# Patient Record
Sex: Male | Born: 1965 | Hispanic: No | Marital: Single | Smoking: Never smoker
Health system: Southern US, Community
[De-identification: ages and names within clinical notes are randomized; demographics above are authoritative.]

## PROBLEM LIST (undated history)

## (undated) DIAGNOSIS — N289 Disorder of kidney and ureter, unspecified: Secondary | ICD-10-CM

## (undated) DIAGNOSIS — K219 Gastro-esophageal reflux disease without esophagitis: Secondary | ICD-10-CM

## (undated) DIAGNOSIS — J45909 Unspecified asthma, uncomplicated: Secondary | ICD-10-CM

## (undated) DIAGNOSIS — G4730 Sleep apnea, unspecified: Secondary | ICD-10-CM

## (undated) DIAGNOSIS — K069 Disorder of gingiva and edentulous alveolar ridge, unspecified: Secondary | ICD-10-CM

## (undated) DIAGNOSIS — I8290 Acute embolism and thrombosis of unspecified vein: Secondary | ICD-10-CM

## (undated) HISTORY — DX: Gastro-esophageal reflux disease without esophagitis: K21.9

## (undated) HISTORY — DX: Disorder of gingiva and edentulous alveolar ridge, unspecified: K06.9

## (undated) HISTORY — DX: Unspecified asthma, uncomplicated: J45.909

## (undated) HISTORY — DX: Sleep apnea, unspecified: G47.30

## (undated) HISTORY — DX: Acute embolism and thrombosis of unspecified vein: I82.90

## (undated) HISTORY — DX: Disorder of kidney and ureter, unspecified: N28.9

---

## 2001-03-20 ENCOUNTER — Emergency Department (HOSPITAL_COMMUNITY): Admission: EM | Admit: 2001-03-20 | Discharge: 2001-03-20 | Payer: Self-pay | Admitting: Emergency Medicine

## 2001-03-20 ENCOUNTER — Encounter: Payer: Self-pay | Admitting: Emergency Medicine

## 2016-01-28 ENCOUNTER — Other Ambulatory Visit: Payer: Self-pay | Admitting: Occupational Medicine

## 2016-01-28 ENCOUNTER — Ambulatory Visit: Payer: Self-pay

## 2016-01-28 DIAGNOSIS — M25561 Pain in right knee: Secondary | ICD-10-CM

## 2016-11-09 ENCOUNTER — Ambulatory Visit (INDEPENDENT_AMBULATORY_CARE_PROVIDER_SITE_OTHER): Payer: Worker's Compensation | Admitting: Orthopaedic Surgery

## 2016-11-09 ENCOUNTER — Encounter (INDEPENDENT_AMBULATORY_CARE_PROVIDER_SITE_OTHER): Payer: Self-pay | Admitting: Orthopaedic Surgery

## 2016-11-09 VITALS — BP 150/93 | HR 59 | Temp 97.8°F | Ht 66.0 in | Wt 215.0 lb

## 2016-11-09 DIAGNOSIS — S68119A Complete traumatic metacarpophalangeal amputation of unspecified finger, initial encounter: Secondary | ICD-10-CM

## 2016-11-09 DIAGNOSIS — S68129A Partial traumatic metacarpophalangeal amputation of unspecified finger, initial encounter: Secondary | ICD-10-CM | POA: Diagnosis not present

## 2016-11-09 NOTE — Progress Notes (Signed)
Office Visit Note   Patient: Douglas Espinoza           Date of Birth: 02/16/1966           MRN: [id number] Visit Date: 11/09/2016              Requested by: No referring provider defined for this encounter. PCP: No primary care provider on file.   Assessment & Plan: Visit Diagnoses:  1. Fingertip amputation, initial encounter     Plan: Work slip given no work 3 weeks. I will recheck him in one week. He may be able to do one-handed light-duty work using his right hand only after a few weeks once he is off pain medication. In the meantime he'll keep his hand elevated at home he can take the Ultram one every 4 hours he will call if the Ultram is not effective in handling the pain and he will continue to take the cephalexin 3 times a day for the full 1 week. Extra 4 x 4 and Coban given in case he has any drainage from the dressing gets wet and he has to change it. Recheck 1 week.  Follow-Up Instructions: No Follow-up on file.   Orders:  No orders of the defined types were placed in this encounter.  No orders of the defined types were placed in this encounter.     Procedures: No procedures performed   Clinical Data: No additional findings.   Subjective: Chief Complaint  Patient presents with  . 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 The tree kicked back and his hand injuring his left fifth finger with a traumatic amputation through the mid distal phalanx with loss of nail bed and nail plate. He went to the emergency room at [facility] had partial closure it was placed on antibiotics cephalexin 500 mg 1 by mouth 3 times a day 1 week and tramadol for pain. 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 No past history of injury to his fingertip. He states his finger is throbbing and painful.  Review of Systems 14  point review of systems performed positive for borderline hypertension negative for diabetes negative for heart disease kidney disease. He is not on any medications from a physician other than Ultram and Keflex that was given him after his finger injury in the emergency room.   Objective: Vital Signs: BP (!) 150/93 (BP Location: Right Arm, Patient Position: Sitting, Cuff Size: Normal)   Pulse (!) 59   Temp 97.8 F (36.6 C) (Oral)   Ht 5\' 6"  (1.676 m)   Wt 215 lb (97.5 kg)   BMI 34.70 kg/m   Physical Exam  Constitutional: He is oriented to person, place, and time. He appears well-developed and well-nourished.  HENT:  Head: Normocephalic and atraumatic.  Eyes: EOM are normal. Pupils are equal, round, and reactive to light.  Neck: No tracheal deviation present. No thyromegaly present.  Cardiovascular: Normal rate.   Pulmonary/Chest: Effort normal. He has no wheezes.  Abdominal: Soft. Bowel sounds are normal.  Neurological: He is alert and oriented to person, place, and time.  Skin: Skin is warm and dry. Capillary refill takes less than 2 seconds.  Psychiatric: He has a normal mood and affect. His behavior is normal. Judgment and thought content normal.    Ortho Exam  left dominant fifth fingertip has partial closure of the tip amputation with absent nail plate and nail bed. No purulent drainage slight bloody drainage. No H necrosis. He was closed with nylon sutures. X-ray shows distal phalanx fracture was extensive comminution.  Specialty Comments:  No specialty comments available.  Imaging: No results found.   PMFS History: There are no active problems to display for this patient.  Past Medical History:  Diagnosis Date  . Acid reflux   . Asthma   . Blood clot in vein   . Gum disease   . Kidney disease   . Sleep apnea     History reviewed. No pertinent family history.  History reviewed. No pertinent surgical history. Social History   Occupational History  . Not on  file.   Social History Main Topics  . Smoking status: Never Smoker  . Smokeless tobacco: Never Used  . Alcohol use Yes  . Drug use: Unknown  . Sexual activity: Not on file

## 2016-11-10 ENCOUNTER — Telehealth (INDEPENDENT_AMBULATORY_CARE_PROVIDER_SITE_OTHER): Payer: Self-pay

## 2016-11-10 NOTE — Telephone Encounter (Signed)
Faxed yesterdays office note and work note to adj per her request.

## 2016-11-16 ENCOUNTER — Encounter (INDEPENDENT_AMBULATORY_CARE_PROVIDER_SITE_OTHER): Payer: Self-pay | Admitting: Orthopaedic Surgery

## 2016-11-16 ENCOUNTER — Ambulatory Visit (INDEPENDENT_AMBULATORY_CARE_PROVIDER_SITE_OTHER): Payer: Worker's Compensation | Admitting: Orthopaedic Surgery

## 2016-11-16 VITALS — BP 135/87 | HR 66 | Ht 66.0 in | Wt 215.0 lb

## 2016-11-16 DIAGNOSIS — S68129D Partial traumatic metacarpophalangeal amputation of unspecified finger, subsequent encounter: Secondary | ICD-10-CM

## 2016-11-16 DIAGNOSIS — S68119D Complete traumatic metacarpophalangeal amputation of unspecified finger, subsequent encounter: Secondary | ICD-10-CM

## 2016-11-16 DIAGNOSIS — S68119A Complete traumatic metacarpophalangeal amputation of unspecified finger, initial encounter: Secondary | ICD-10-CM | POA: Insufficient documentation

## 2016-11-16 MED ORDER — TRAMADOL HCL 50 MG PO TABS: 50.0000 mg | ORAL_TABLET | Freq: Four times a day (QID) | ORAL | 0 refills | Status: AC | PRN

## 2016-11-16 NOTE — Progress Notes (Signed)
Office Visit Note   Patient: Douglas Espinoza           Date of Birth: December 12, 1965           MRN: [id number] Visit Date: 11/16/2016              Requested by: No referring provider defined for this encounter. PCP: Patient, No Pcp Per   Assessment & Plan: Visit Diagnoses:  1. Traumatic amputation of fingertip, subsequent encounter     Plan: Dressing change sutures were left in. We'll recheck him in 2 weeks. He states he does not think they have any one-handed work available for him. Tramadol was refilled I will recheck him in 2 weeks.  Follow-Up Instructions: Return in about 2 weeks (around 11/30/2016).   Orders:  No orders of the defined types were placed in this encounter.  Meds ordered this encounter  Medications  . traMADol (ULTRAM) 50 MG tablet    Sig: Take 1 tablet (50 mg total) by mouth every 6 (six) hours as needed for moderate pain.    Dispense:  30 tablet    Refill:  0      Procedures: No procedures performed   Clinical Data: No additional findings.   Subjective: Chief Complaint  Patient presents with  . Left Little Finger - Follow-up, Wound Check    HPI follow-up traumatic left fifth finger amputation through the mid distal phalanx level. He is out of his antibiotics needs a refill on his tramadol. His pain is been the moderate to low worse since a dressing was removed today.  Review of Systems 14 point review of systems updated and is unchanged from last office visit.   Objective: Vital Signs: BP 135/87   Pulse 66   Ht 5\' 6"  (1.676 m)   Wt 215 lb (97.5 kg)   BMI 34.70 kg/m   Physical Exam  Constitutional: He is oriented to person, place, and time. He appears well-developed and well-nourished.  HENT:  Head: Normocephalic and atraumatic.  Eyes: Pupils are equal, round, and reactive to light. EOM are normal.  Neck: No tracheal deviation present. No thyromegaly present.  Cardiovascular: Normal rate.   Pulmonary/Chest: Effort normal. He has no  wheezes.  Abdominal: Soft. Bowel sounds are normal.  Musculoskeletal:  No cellulitis of the finger. No skin flap necrosis. Sutures are still present and with the swelling hasn't finger they will remain in.  Neurological: He is alert and oriented to person, place, and time.  Skin: Skin is warm and dry. Capillary refill takes less than 2 seconds.  Psychiatric: He has a normal mood and affect. His behavior is normal. Judgment and thought content normal.    Ortho Exam  Specialty Comments:  No specialty comments available.  Imaging: No results found.   PMFS History: Patient Active Problem List   Diagnosis Date Noted  . Traumatic amputation of finger tip 11/16/2016   Past Medical History:  Diagnosis Date  . Acid reflux   . Asthma   . Blood clot in vein   . Gum disease   . Kidney disease   . Sleep apnea     No family history on file.  No past surgical history on file. Social History   Occupational History  . Not on file.   Social History Main Topics  . Smoking status: Never Smoker  . Smokeless tobacco: Never Used  . Alcohol use Yes  . Drug use: Unknown  . Sexual activity: Not on file

## 2016-11-21 ENCOUNTER — Telehealth (INDEPENDENT_AMBULATORY_CARE_PROVIDER_SITE_OTHER): Payer: Self-pay

## 2016-11-21 NOTE — Telephone Encounter (Signed)
Faxed both July office notes and work notes to [employer] adj per her request

## 2016-11-30 ENCOUNTER — Encounter (INDEPENDENT_AMBULATORY_CARE_PROVIDER_SITE_OTHER): Payer: Self-pay | Admitting: Orthopaedic Surgery

## 2016-11-30 ENCOUNTER — Ambulatory Visit (INDEPENDENT_AMBULATORY_CARE_PROVIDER_SITE_OTHER): Payer: Worker's Compensation | Admitting: Orthopaedic Surgery

## 2016-11-30 VITALS — BP 148/88 | HR 54 | Ht 66.0 in | Wt 215.0 lb

## 2016-11-30 DIAGNOSIS — S68129D Partial traumatic metacarpophalangeal amputation of unspecified finger, subsequent encounter: Secondary | ICD-10-CM | POA: Diagnosis not present

## 2016-11-30 DIAGNOSIS — S68119D Complete traumatic metacarpophalangeal amputation of unspecified finger, subsequent encounter: Secondary | ICD-10-CM

## 2016-11-30 NOTE — Progress Notes (Signed)
   Office Visit Note   Patient: Douglas Espinoza           Date of Birth: 1965-05-10           MRN: [id number] Visit Date: 11/30/2016              Requested by: No referring provider defined for this encounter. PCP: Patient, No Pcp Per   Assessment & Plan: Visit Diagnoses:  1. Traumatic amputation of fingertip, subsequent encounter     Plan: Work slip given for work resumption on 12/06/2016 no work restrictions at that time. Sutures were harvested today area no drainage from the tip. He'll work on PIP range of motion on his own we went over several exercises. Return in 6 weeks for likely impairment rating.  Follow-Up Instructions: Return in about 6 weeks (around 01/11/2017).   Orders:  No orders of the defined types were placed in this encounter.  No orders of the defined types were placed in this encounter.     Procedures: No procedures performed   Clinical Data: No additional findings.   Subjective: Chief Complaint  Patient presents with  . Left Little Finger - Follow-up    HPI follow-up fifth finger traumatic amputation close in the emergency room. Fractures through the mid distal phalanx. Sutures are intact. He has been out of work.  Review of Systems review of systems is updated and unchanged from last office visit a few weeks ago.   Objective: Vital Signs: BP (!) 148/88   Pulse (!) 54   Ht 5\' 6"  (1.676 m)   Wt 215 lb (97.5 kg)   BMI 34.70 kg/m   Physical Exam  Constitutional: He is oriented to person, place, and time. He appears well-developed and well-nourished.  HENT:  Head: Normocephalic and atraumatic.  Eyes: Pupils are equal, round, and reactive to light. EOM are normal.  Neck: No tracheal deviation present. No thyromegaly present.  Cardiovascular: Normal rate.   Pulmonary/Chest: Effort normal. He has no wheezes.  Abdominal: Soft. Bowel sounds are normal.  Neurological: He is alert and oriented to person, place, and time.  Skin: Skin is warm and  dry. Capillary refill takes less than 2 seconds.  Psychiatric: He has a normal mood and affect. His behavior is normal. Judgment and thought content normal.    Ortho Exam Traumatic amputation left fifth finger through the distal phalanx level. Sutures are ready for harvest. He has some tenderness. PIP shows 50% range of motion and he'll work on the active and passive range of motion. Specialty Comments:  No specialty comments available.  Imaging: No results found.   PMFS History: Patient Active Problem List   Diagnosis Date Noted  . Traumatic amputation of finger tip 11/16/2016   Past Medical History:  Diagnosis Date  . Acid reflux   . Asthma   . Blood clot in vein   . Gum disease   . Kidney disease   . Sleep apnea     No family history on file.  No past surgical history on file. Social History   Occupational History  . Not on file.   Social History Main Topics  . Smoking status: Never Smoker  . Smokeless tobacco: Never Used  . Alcohol use Yes  . Drug use: Unknown  . Sexual activity: Not on file

## 2016-12-01 ENCOUNTER — Telehealth (INDEPENDENT_AMBULATORY_CARE_PROVIDER_SITE_OTHER): Payer: Self-pay

## 2016-12-01 NOTE — Telephone Encounter (Signed)
Emailed the 11/30/16 office and work note per her request

## 2017-01-11 ENCOUNTER — Encounter (INDEPENDENT_AMBULATORY_CARE_PROVIDER_SITE_OTHER): Payer: Self-pay | Admitting: Orthopaedic Surgery

## 2017-01-11 ENCOUNTER — Ambulatory Visit (INDEPENDENT_AMBULATORY_CARE_PROVIDER_SITE_OTHER): Payer: Worker's Compensation | Admitting: Orthopaedic Surgery

## 2017-01-11 VITALS — BP 146/90 | HR 60 | Ht 66.0 in | Wt 215.0 lb

## 2017-01-11 DIAGNOSIS — S68129D Partial traumatic metacarpophalangeal amputation of unspecified finger, subsequent encounter: Secondary | ICD-10-CM | POA: Diagnosis not present

## 2017-01-11 DIAGNOSIS — S68119D Complete traumatic metacarpophalangeal amputation of unspecified finger, subsequent encounter: Secondary | ICD-10-CM

## 2017-01-11 NOTE — Progress Notes (Signed)
Office Visit Note   Patient: Douglas Espinoza           Date of Birth: 04-29-1966           MRN: [id number] Visit Date: 01/11/2017              Requested by: No referring provider defined for this encounter. PCP: Patient, No Pcp Per   Assessment & Plan: Visit Diagnoses:  1. Traumatic amputation of fingertip, subsequent encounter     Plan: Patient is back doing normal work. Based on the amputation of the distal phalanx which is proximal to the base of the visible nail is impairment would be one half (50%) of the left little finger. Patient is at [employer] is doing regular work and can return on a when necessary basis.  Follow-Up Instructions: Return if symptoms worsen or fail to improve.   Orders:  No orders of the defined types were placed in this encounter.  No orders of the defined types were placed in this encounter.     Procedures: No procedures performed   Clinical Data: No additional findings.   Subjective: Chief Complaint  Patient presents with  . Left Little Finger - Follow-up    HPI 51 year old male returns for follow-up of a traumatic left fifth finger amputation through the distal phalanx proximal to the base of the visible nail. The tip is healed he is back working. Nail plate and nail bed is gone. He had taken some tramadol for the pain is work on PIP range of motion. He has a little bit of stiffness at the PIP joint and lacks the 20 reaching full extension. He's continuing to work on flexibility.  Review of Systems 14 point review of systems updated and is unchanged.   Objective: Vital Signs: BP (!) 146/90   Pulse 60   Ht 5\' 6"  (1.676 m)   Wt 215 lb (97.5 kg)   BMI 34.70 kg/m   Physical Exam  Constitutional: He is oriented to person, place, and time. He appears well-developed and well-nourished.  HENT:  Head: Normocephalic and atraumatic.  Eyes: Pupils are equal, round, and reactive to light. EOM are normal.  Neck: No tracheal deviation present.  No thyromegaly present.  Cardiovascular: Normal rate.   Pulmonary/Chest: Effort normal. He has no wheezes.  Abdominal: Soft. Bowel sounds are normal.  Musculoskeletal:  Tip of the fingers completely healed. He has no drainage. Amputation through the mid distal phalanx level. He has 50% the IP range of motion. PIP has flexion to about 90 and has a 20 flexion contracture. MCP shows 0-100 range of motion.  Neurological: He is alert and oriented to person, place, and time.  Skin: Skin is warm and dry. Capillary refill takes less than 2 seconds.  Psychiatric: He has a normal mood and affect. His behavior is normal. Judgment and thought content normal.    Ortho Exam  Specialty Comments:  No specialty comments available.  Imaging: No results found.   PMFS History: Patient Active Problem List   Diagnosis Date Noted  . Traumatic amputation of finger tip 11/16/2016   Past Medical History:  Diagnosis Date  . Acid reflux   . Asthma   . Blood clot in vein   . Gum disease   . Kidney disease   . Sleep apnea     No family history on file.  No past surgical history on file. Social History   Occupational History  . Not on file.   Social History  Main Topics  . Smoking status: Never Smoker  . Smokeless tobacco: Never Used  . Alcohol use Yes  . Drug use: Unknown  . Sexual activity: Not on file

## 2017-01-16 ENCOUNTER — Telehealth (INDEPENDENT_AMBULATORY_CARE_PROVIDER_SITE_OTHER): Payer: Self-pay

## 2017-01-16 NOTE — Telephone Encounter (Signed)
Faxed the 01/11/17 office note to [facility] [employer] [phone]

## 2017-02-20 ENCOUNTER — Encounter: Payer: Self-pay | Admitting: Physician Assistant

## 2017-02-20 ENCOUNTER — Ambulatory Visit (INDEPENDENT_AMBULATORY_CARE_PROVIDER_SITE_OTHER): Payer: BLUE CROSS/BLUE SHIELD | Admitting: Physician Assistant

## 2017-02-20 VITALS — BP 148/88 | HR 85 | Temp 97.8°F | Resp 16 | Ht 66.0 in | Wt 217.0 lb

## 2017-02-20 DIAGNOSIS — R21 Rash and other nonspecific skin eruption: Secondary | ICD-10-CM | POA: Diagnosis not present

## 2017-02-20 LAB — POCT SKIN KOH: Skin KOH, POC: NEGATIVE

## 2017-02-20 MED ORDER — CLOTRIMAZOLE-BETAMETHASONE 1-0.05 % EX CREA: 1.0000 "application " | TOPICAL_CREAM | Freq: Two times a day (BID) | CUTANEOUS | 0 refills | Status: AC

## 2017-02-20 NOTE — Patient Instructions (Addendum)
The fungal test was negative.  Please apply the cream as I prescribed. I would like you to follow up in 2 weeks. Make sure you are wearing gloves when you are working.  Make sure your gloves are clean.     IF you received an x-ray today, you will receive an invoice from [facility]. Please contact [facility] at [phone] with questions or concerns regarding your invoice.   IF you received labwork today, you will receive an invoice from [facility]. Please contact [facility] at [phone] with questions or concerns regarding your invoice.   Our billing staff will not be able to assist you with questions regarding bills from these companies.  You will be contacted with the lab results as soon as they are available. The fastest way to get your results is to activate your My Chart account. Instructions are located on the last page of this paperwork. If you have not heard from us regarding the results in 2 weeks, please contact this office.

## 2017-02-20 NOTE — Progress Notes (Signed)
[facility] [address], [place] [phone]  Date:  02/20/2017   Name:  Douglas BobJavier R Espinoza   DOB:  1966-02-18   MRN:  [id number]  PCP:  Patient, No Pcp Per    History of Present Illness:  Douglas Espinoza is a 51 y.o. male patient who presents to PCP with  Chief Complaint  Patient presents with  . Eczema    dry skin on palms of hands and sometimes on bottom of feet, pt would like oral medication instead of topical cream      dry skin on the palms of hand.  At times this is very pruritic.  He will have a little pain on the creases.  More prominent in the cold.  He has never gone anywhere for treatment of this. He is using gloves at work.  Works with trees.  Denies any work with chemicals.   Patient Active Problem List   Diagnosis Date Noted  . Traumatic amputation of finger tip 11/16/2016    Past Medical History:  Diagnosis Date  . Acid reflux   . Asthma   . Blood clot in vein   . Gum disease   . Kidney disease   . Sleep apnea     History reviewed. No pertinent surgical history.  Social History  Substance Use Topics  . Smoking status: Never Smoker  . Smokeless tobacco: Never Used  . Alcohol use Yes    History reviewed. No pertinent family history.  No Known Allergies  Medication list has been reviewed and updated.  Current Outpatient Prescriptions on File Prior to Visit  Medication Sig Dispense Refill  . cephALEXin (KEFLEX) 500 MG capsule   0  . traMADol (ULTRAM) 50 MG tablet Take 1 tablet (50 mg total) by mouth every 6 (six) hours as needed for moderate pain. (Patient not taking: Reported on 02/20/2017) 30 tablet 0   No current facility-administered medications on file prior to visit.     ROS ROS otherwise unremarkable unless listed above.  Physical Examination: BP (!) 148/88   Pulse 85   Temp 97.8 F (36.6 C) (Oral)   Resp 16   Ht 5\' 6"  (1.676 m)   Wt 217 lb (98.4 kg)   SpO2 96%   BMI 35.02 kg/m  Ideal Body Weight:  Weight in (lb) to have BMI = 25: 154.6  Physical Exam  Constitutional: He is oriented to person, place, and time. He appears well-developed and well-nourished. No distress.  HENT:  Head: Normocephalic and atraumatic.  Eyes: Pupils are equal, round, and reactive to light. Conjunctivae and EOM are normal.  Cardiovascular: Normal rate.   Pulmonary/Chest: Effort normal. No respiratory distress.  Neurological: He is alert and oriented to person, place, and time.  Skin: Skin is warm and dry. Capillary refill takes less than 2 seconds. He is not diaphoretic.  Palms with dry flaking that is whitish yellow.  Small shallow fissures along the natural creases of hand.  This is more prominent at the proximal area of the thenar position of 1st finger just distal to the wrist.   Psychiatric: He has a normal mood and affect. His behavior is normal.   Results for orders placed or performed in visit on 02/20/17  POCT Skin KOH  Result Value Ref Range   Skin KOH, POC Negative Negative     Assessment and Plan: Douglas BobJavier R Espinoza is a 51 y.o. male who is here today for cc of  Chief Complaint  Patient presents with  . Eczema    dry skin on palms of hands and sometimes on bottom of feet, pt would like oral medication instead of topical cream   Rash possibly secondary to eczema, fungal, contact dermatitis.  Will dually treat with lotrisone.  Twice per day.  He will follow up in 2 weeks.  Rash and nonspecific skin eruption - Plan: POCT Skin KOH, clotrimazole-betamethasone (LOTRISONE) cream  Trena Platt, PA-C [facility] 10/23/20189:50 AM

## 2017-03-06 ENCOUNTER — Ambulatory Visit: Payer: Self-pay | Admitting: Physician Assistant

## 2017-07-31 ENCOUNTER — Encounter: Payer: Self-pay | Admitting: Physician Assistant

## 2018-02-07 IMAGING — CR DG KNEE COMPLETE 4+V*R*
4 series · 4 of 4 positions shown · non-contrast
Comparison: No recent prior.

CLINICAL DATA: Fall.  Pain.

EXAM:
RIGHT KNEE - COMPLETE 4+ VIEW

[view not recorded (1 of 4)]
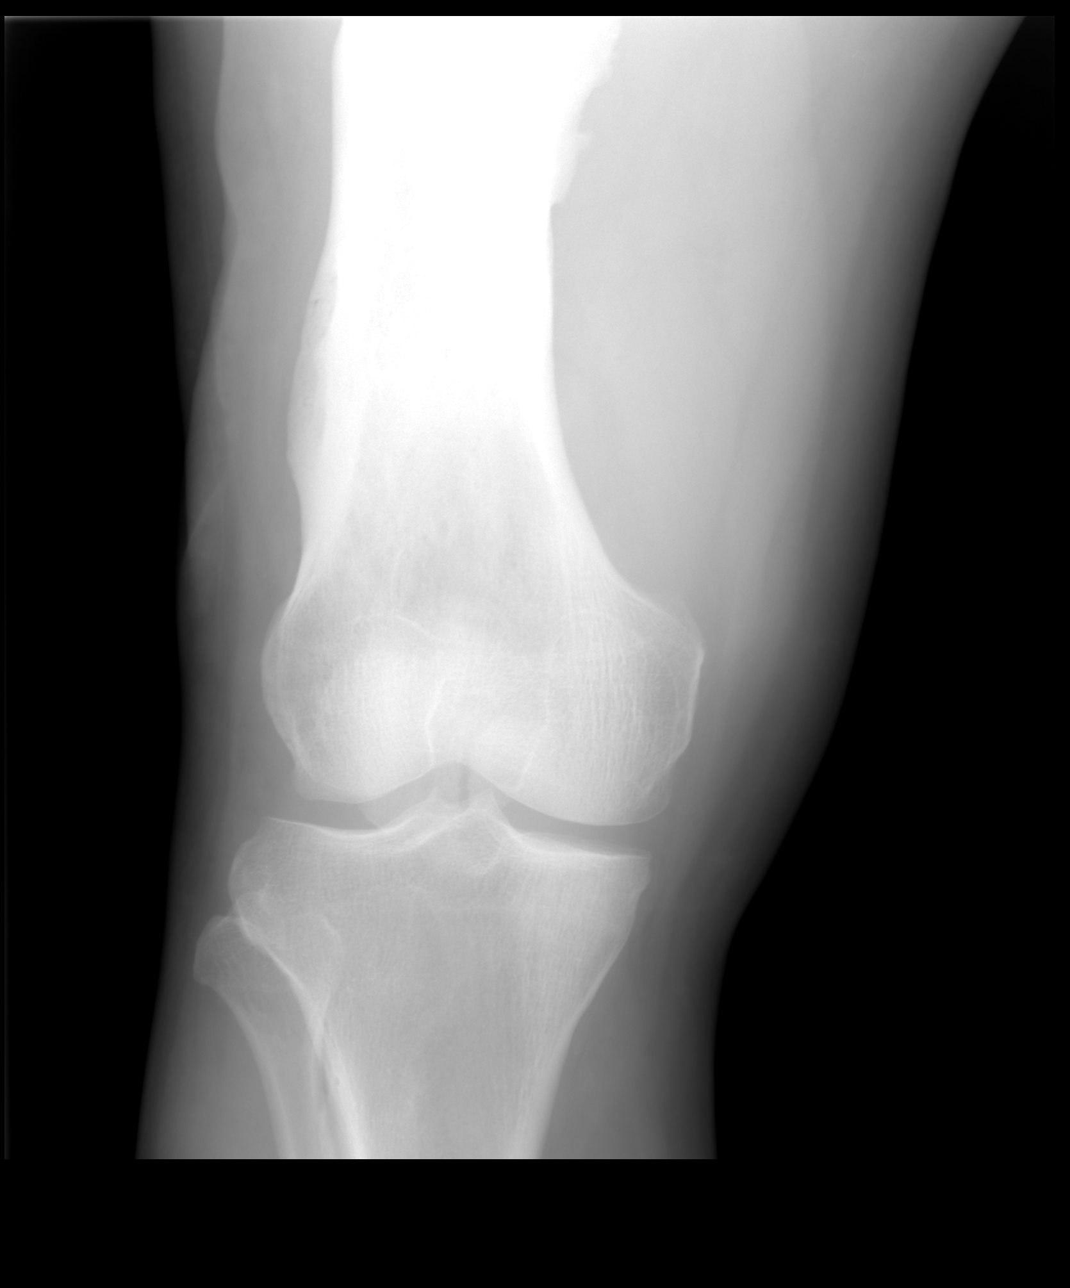

[view not recorded (2 of 4)]
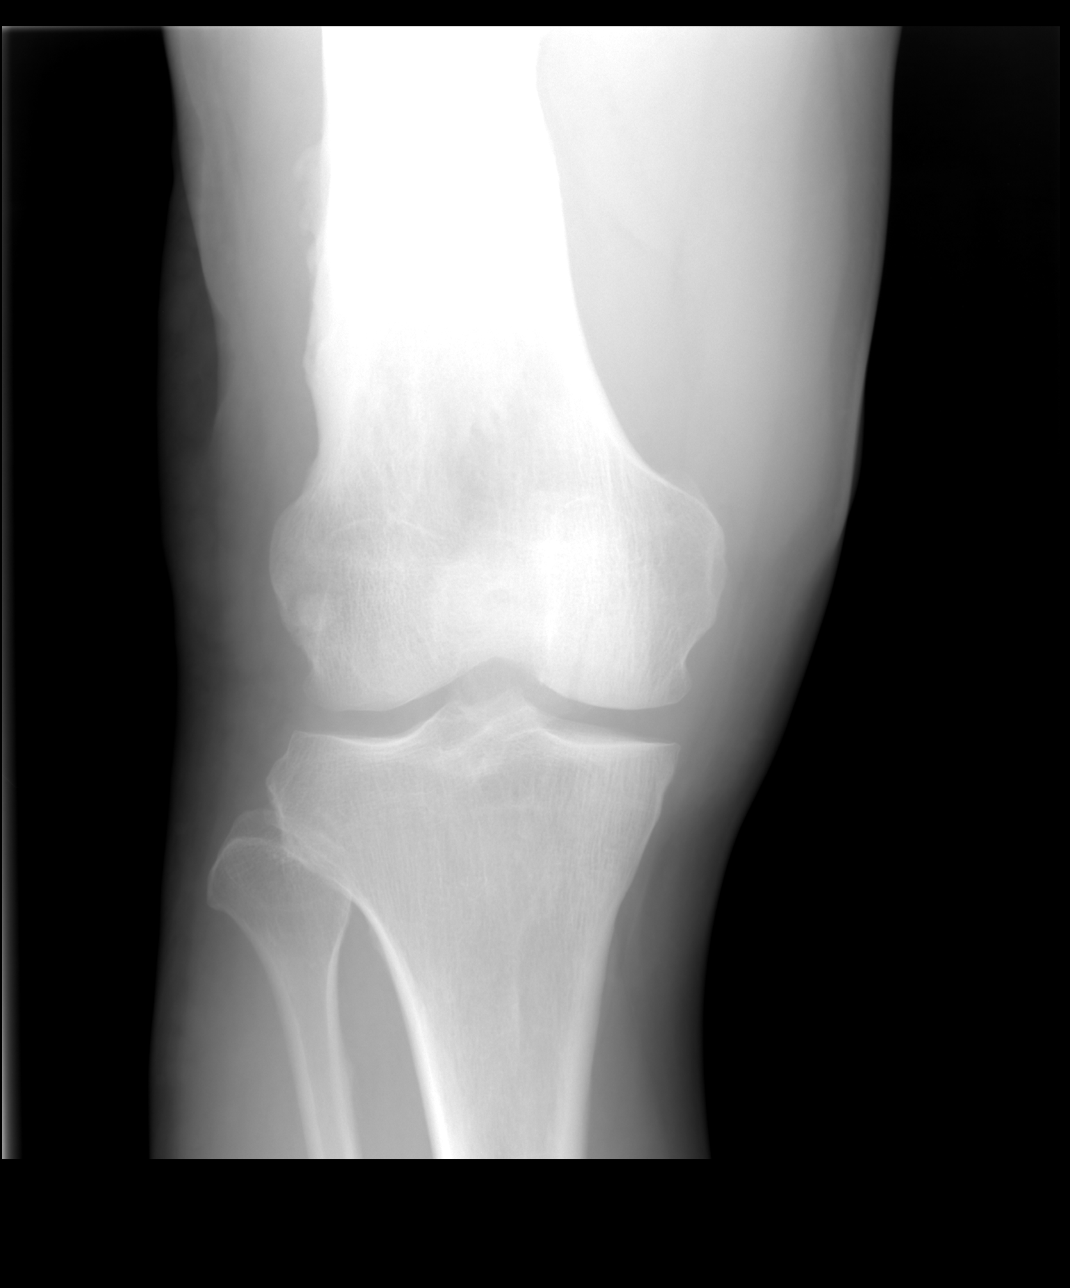

[view not recorded (3 of 4)]
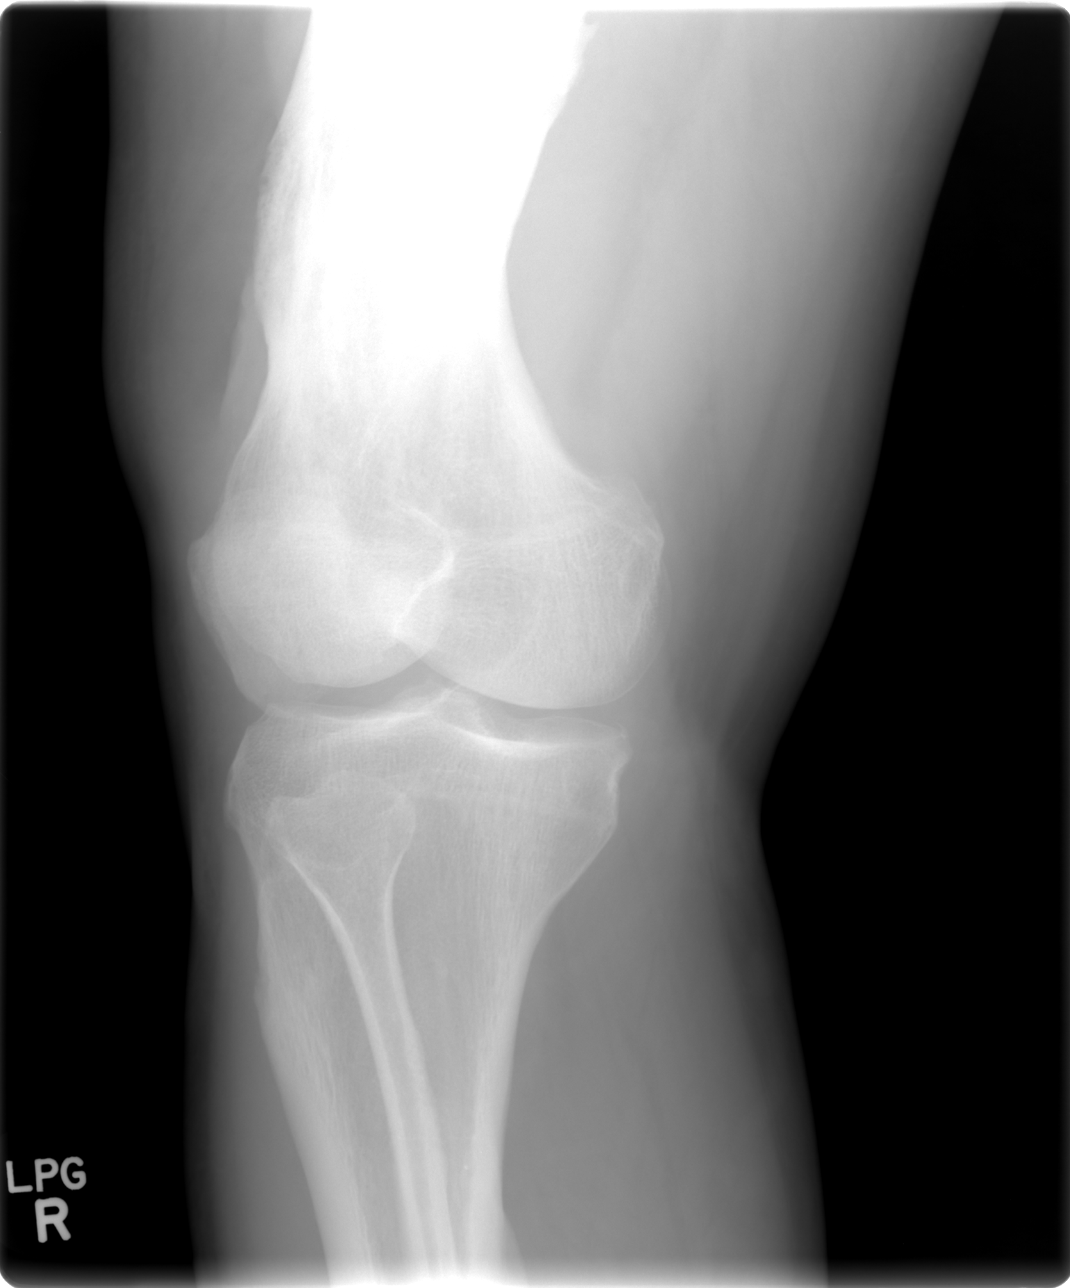

[view not recorded (4 of 4)]
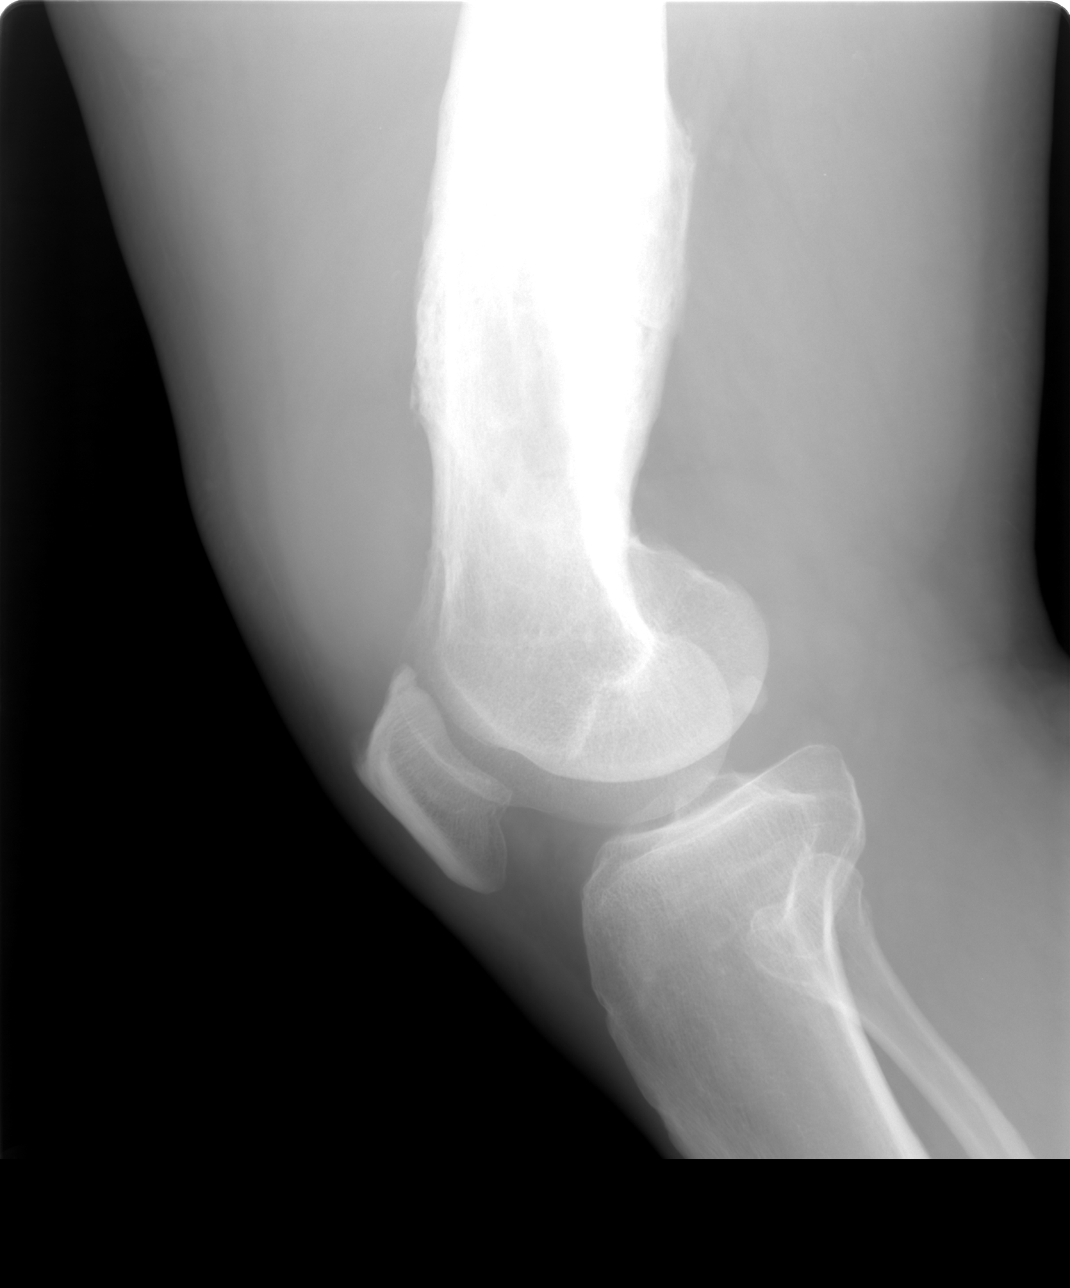

[4 of 4 positions shown; findings below may reference images not displayed]

FINDINGS: Deformity with prominent callus formation noted about the distal
femur, this is most likely from old healed fracture. No acute bony
abnormalities identified. Knee joint effusion cannot be excluded.
IMPRESSION: Deformity with prominent callus formation noted about the distal
femur. This is most likely from old healed fracture. No acute bony
abnormality identified. Knee joint effusion cannot be excluded .

## 2019-09-27 ENCOUNTER — Encounter: Payer: Self-pay | Admitting: Family Medicine

## 2022-01-06 ENCOUNTER — Encounter: Payer: Self-pay | Admitting: Physician Assistant
